# Patient Record
Sex: Male | Born: 1980 | Race: Black or African American | Hispanic: No | Marital: Married | Smoking: Current every day smoker
Health system: Southern US, Community
[De-identification: ages and names within clinical notes are randomized; demographics above are authoritative.]

## PROBLEM LIST (undated history)

## (undated) DIAGNOSIS — M62830 Muscle spasm of back: Secondary | ICD-10-CM

## (undated) DIAGNOSIS — F4310 Post-traumatic stress disorder, unspecified: Secondary | ICD-10-CM

## (undated) DIAGNOSIS — T7840XA Allergy, unspecified, initial encounter: Secondary | ICD-10-CM

## (undated) HISTORY — PX: BACK SURGERY: SHX140

## (undated) HISTORY — DX: Allergy, unspecified, initial encounter: T78.40XA

## (undated) HISTORY — PX: KNEE ARTHROSCOPY: SHX127

---

## 2005-08-10 ENCOUNTER — Emergency Department (HOSPITAL_COMMUNITY): Admission: EM | Admit: 2005-08-10 | Discharge: 2005-08-10 | Payer: Self-pay | Admitting: Emergency Medicine

## 2008-02-03 ENCOUNTER — Emergency Department (HOSPITAL_COMMUNITY): Admission: EM | Admit: 2008-02-03 | Discharge: 2008-02-04 | Payer: Self-pay | Admitting: Emergency Medicine

## 2012-12-13 ENCOUNTER — Ambulatory Visit: Payer: Self-pay | Admitting: Emergency Medicine

## 2012-12-13 VITALS — BP 110/80 | HR 76 | Temp 98.6°F | Resp 16 | Ht 73.75 in | Wt 191.0 lb

## 2012-12-13 DIAGNOSIS — R197 Diarrhea, unspecified: Secondary | ICD-10-CM

## 2012-12-13 DIAGNOSIS — A088 Other specified intestinal infections: Secondary | ICD-10-CM

## 2012-12-13 LAB — POCT CBC
Lymph, poc: 2.3 (ref 0.6–3.4)
MCH, POC: 28.4 pg (ref 27–31.2)
MCHC: 32.1 g/dL (ref 31.8–35.4)
MCV: 88.7 fL (ref 80–97)
MID (cbc): 0.6 (ref 0–0.9)
MPV: 9.8 fL (ref 0–99.8)
POC LYMPH PERCENT: 32.4 %L (ref 10–50)
POC MID %: 8.4 %M (ref 0–12)
Platelet Count, POC: 212 10*3/uL (ref 142–424)
WBC: 7.2 10*3/uL (ref 4.6–10.2)

## 2012-12-13 MED ORDER — LOPERAMIDE HCL 2 MG PO TABS: ORAL_TABLET | ORAL | Status: DC

## 2012-12-13 MED ORDER — ONDANSETRON 8 MG PO TBDP: 8.0000 mg | ORAL_TABLET | Freq: Three times a day (TID) | ORAL | Status: DC | PRN

## 2012-12-13 NOTE — Patient Instructions (Addendum)
Clear Liquid Diet The clear liquid dietconsists of foods that are liquid or will become liquid at room temperature.You should be able to see through the liquid and beverages. Examples of foods allowed on a clear liquid diet include fruit juice, broth or bouillon, gelatin, or frozen ice pops. The purpose of this diet is to provide necessary fluid, electrolytes such as sodium and potassium, and energy to keep the body functioning during times when you are not able to consume a regular diet.A clear liquid diet should not be continued for long periods of time as it is not nutritionally adequate.  REASONS FOR USING A CLEAR LIQUID DIET  In sudden onset (acute) conditions for a patient before or after surgery.  As the first step in oral feeding.  For fluid and electrolyte replacement in diarrheal diseases.  As a diet before certain medical tests are performed. ADEQUACY The clear liquid diet is adequate only in ascorbic acid, according to the Recommended Dietary Allowances of the National Research Council. CHOOSING FOODS Breads and Starches  Allowed:  None are allowed.  Avoid: All are avoided. Vegetables  Allowed:  Strained tomato or vegetable juice.  Avoid: Any others. Fruit  Allowed:  Strained fruit juices and fruit drinks. Include 1 serving of citrus or vitamin C-enriched fruit juice daily.  Avoid: Any others. Meat and Meat Substitutes  Allowed:  None are allowed.  Avoid: All are avoided. Milk  Allowed:  None are allowed.  Avoid: All are avoided. Soups and Combination Foods  Allowed:  Clear bouillon, broth, or strained broth-based soups.  Avoid: Any others. Desserts and Sweets  Allowed:  Sugar, honey. High protein gelatin. Flavored gelatin, ices, or frozen ice pops that do not contain milk.  Avoid: Any others. Fats and Oils  Allowed:  None are allowed.  Avoid: All are avoided. Beverages  Allowed: Cereal beverages, coffee (regular or decaffeinated), tea, or soda  at the discretion of your caregiver.  Avoid: Any others. Condiments  Allowed:  Iodized salt.  Avoid: Any others, including pepper. Supplements  Allowed:  Liquid nutrition beverages.  Avoid: Any others that contain lactose or fiber. SAMPLE MEAL PLAN Breakfast  4 oz (120 mL) strained orange juice.   to 1 cup (125 to 250 mL) gelatin (plain or fortified).  1 cup (250 mL) beverage (coffee or tea).  Sugar, if desired. Midmorning Snack   cup (125 mL) gelatin (plain or fortified). Lunch  1 cup (250 mL) broth or consomm.  4 oz (120 mL) strained grapefruit juice.   cup (125 mL) gelatin (plain or fortified).  1 cup (250 mL) beverage (coffee or tea).  Sugar, if desired. Midafternoon Snack   cup (125 mL) fruit ice.   cup (125 mL) strained fruit juice. Dinner  1 cup (250 mL) broth or consomm.   cup (125 mL) cranberry juice.   cup (125 mL) flavored gelatin (plain or fortified).  1 cup (250 mL) beverage (coffee or tea).  Sugar, if desired. Evening Snack  4 oz (120 mL) strained apple juice (vitamin C-fortified).   cup (125 mL) flavored gelatin (plain or fortified). Document Released: 09/08/2005 Document Revised: 12/01/2011 Document Reviewed: 12/06/2010 ExitCare Patient Information 2013 [employer]. Viral Gastroenteritis Viral gastroenteritis is also known as stomach flu. This condition affects the stomach and intestinal tract. It can cause sudden diarrhea and vomiting. The illness typically lasts 3 to 8 days. Most people develop an immune response that eventually gets rid of the virus. While this natural response develops, the virus can make you   quite ill. CAUSES  Many different viruses can cause gastroenteritis, such as rotavirus or noroviruses. You can catch one of these viruses by consuming contaminated food or water. You may also catch a virus by sharing utensils or other personal items with an infected person or by touching a contaminated  surface. SYMPTOMS  The most common symptoms are diarrhea and vomiting. These problems can cause a severe loss of body fluids (dehydration) and a body salt (electrolyte) imbalance. Other symptoms may include:  Fever.  Headache.  Fatigue.  Abdominal pain. DIAGNOSIS  Your caregiver can usually diagnose viral gastroenteritis based on your symptoms and a physical exam. A stool sample may also be taken to test for the presence of viruses or other infections. TREATMENT  This illness typically goes away on its own. Treatments are aimed at rehydration. The most serious cases of viral gastroenteritis involve vomiting so severely that you are not able to keep fluids down. In these cases, fluids must be given through an intravenous line (IV). HOME CARE INSTRUCTIONS   Drink enough fluids to keep your urine clear or pale yellow. Drink small amounts of fluids frequently and increase the amounts as tolerated.  Ask your caregiver for specific rehydration instructions.  Avoid:  Foods high in sugar.  Alcohol.  Carbonated drinks.  Tobacco.  Juice.  Caffeine drinks.  Extremely hot or cold fluids.  Fatty, greasy foods.  Too much intake of anything at one time.  Dairy products until 24 to 48 hours after diarrhea stops.  You may consume probiotics. Probiotics are active cultures of beneficial bacteria. They may lessen the amount and number of diarrheal stools in adults. Probiotics can be found in yogurt with active cultures and in supplements.  Wash your hands well to avoid spreading the virus.  Only take over-the-counter or prescription medicines for pain, discomfort, or fever as directed by your caregiver. Do not give aspirin to children. Antidiarrheal medicines are not recommended.  Ask your caregiver if you should continue to take your regular prescribed and over-the-counter medicines.  Keep all follow-up appointments as directed by your caregiver. SEEK IMMEDIATE MEDICAL CARE IF:    You are unable to keep fluids down.  You do not urinate at least once every 6 to 8 hours.  You develop shortness of breath.  You notice blood in your stool or vomit. This may look like coffee grounds.  You have abdominal pain that increases or is concentrated in one small area (localized).  You have persistent vomiting or diarrhea.  You have a fever.  The patient is a child younger than 3 months, and he or she has a fever.  The patient is a child older than 3 months, and he or she has a fever and persistent symptoms.  The patient is a child older than 3 months, and he or she has a fever and symptoms suddenly get worse.  The patient is a baby, and he or she has no tears when crying. MAKE SURE YOU:   Understand these instructions.  Will watch your condition.  Will get help right away if you are not doing well or get worse. Document Released: 09/08/2005 Document Revised: 12/01/2011 Document Reviewed: 06/25/2011 ExitCare Patient Information 2013 [employer].  

## 2012-12-13 NOTE — Progress Notes (Signed)
[facility] [address] [place] [address] [phone]  Date:  12/13/2012   Name:  Mario Matthews   DOB:  [birth year]   MRN:  [id number]  PCP:  No primary provider on file.    Chief Complaint: Diarrhea, Nausea and Abdominal Pain   History of Present Illness:  Mario Matthews is a 31 y.o. very pleasant male patient who presents with the following:  Ill with diarrheal stools for past 1 1/2 weeks.  Has 5-7 stools a day associated with nausea although he has not vomited.  No fever or chills. The patient has no complaint of blood, mucous, or pus in her stools. Multiple ill coworkers with similar symptoms.  Denies foreign travel or sketchy water supply.  Says he has lost 5-7 pounds.  No improvement with over the counter medications or other home remedies. Denies other complaint or health concern today.   There is no problem list on file for this patient.   Past Medical History  Diagnosis Date  . Allergy     Past Surgical History  Procedure Laterality Date  . Knee arthroscopy      History  Substance Use Topics  . Smoking status: Current Every Day Smoker  . Smokeless tobacco: Not on file  . Alcohol Use: 8.4 oz/week    14 Cans of beer per week    Family History  Problem Relation Age of Onset  . Stroke Mother     No Known Allergies  Medication list has been reviewed and updated.  No current outpatient prescriptions on file prior to visit.   No current facility-administered medications on file prior to visit.    Review of Systems:  As per HPI, otherwise negative.    Physical Examination: Filed Vitals:   12/13/12 1527  BP: 110/80  Pulse: 76  Temp: 98.6 F (37 C)  Resp: 16   Filed Vitals:   12/13/12 1527  Height: 6' 1.75" (1.873 m)  Weight: 191 lb (86.637 kg)   Body mass index is 24.7 kg/(m^2). Ideal Body Weight: Weight in (lb) to have BMI = 25: 193  GEN: WDWN, NAD, Non-toxic, A & O x 3 HEENT: Atraumatic, Normocephalic. Neck  supple. No masses, No LAD. Ears and Nose: No external deformity. CV: RRR, No M/G/R. No JVD. No thrill. No extra heart sounds. PULM: CTA B, no wheezes, crackles, rhonchi. No retractions. No resp. distress. No accessory muscle use. ABD: S, NT, ND, +BS. No rebound. No HSM. EXTR: No c/c/e NEURO Normal gait.  PSYCH: Normally interactive. Conversant. Not depressed or anxious appearing.  Calm demeanor. .   Assessment and Plan: Gastroenteritis Imodium zofran   Signed,  Phillips Odor, MD   Results for orders placed in visit on 12/13/12  POCT CBC      Result Value Range   WBC 7.2  4.6 - 10.2 K/uL   Lymph, poc 2.3  0.6 - 3.4   POC LYMPH PERCENT 32.4  10 - 50 %L   MID (cbc) 0.6  0 - 0.9   POC MID % 8.4  0 - 12 %M   POC Granulocyte 4.3  2 - 6.9   Granulocyte percent 59.2  37 - 80 %G   RBC 5.00  4.69 - 6.13 M/uL   Hemoglobin 14.2  14.1 - 18.1 g/dL   HCT, POC 65.7  84.6 - 53.7 %   MCV 88.7  80 - 97 fL   MCH, POC 28.4  27 - 31.2 pg   MCHC  32.1  31.8 - 35.4 g/dL   RDW, POC 16.1     Platelet Count, POC 212  142 - 424 K/uL   MPV 9.8  0 - 99.8 fL

## 2014-03-16 ENCOUNTER — Emergency Department (HOSPITAL_BASED_OUTPATIENT_CLINIC_OR_DEPARTMENT_OTHER)
Admission: EM | Admit: 2014-03-16 | Discharge: 2014-03-16 | Disposition: A | Discharge status: Home / Self Care | Payer: Non-veteran care | Attending: Emergency Medicine | Admitting: Emergency Medicine

## 2014-03-16 ENCOUNTER — Encounter (HOSPITAL_BASED_OUTPATIENT_CLINIC_OR_DEPARTMENT_OTHER): Payer: Self-pay | Admitting: Emergency Medicine

## 2014-03-16 DIAGNOSIS — F17200 Nicotine dependence, unspecified, uncomplicated: Secondary | ICD-10-CM | POA: Insufficient documentation

## 2014-03-16 DIAGNOSIS — Z8659 Personal history of other mental and behavioral disorders: Secondary | ICD-10-CM | POA: Insufficient documentation

## 2014-03-16 DIAGNOSIS — M62830 Muscle spasm of back: Secondary | ICD-10-CM

## 2014-03-16 DIAGNOSIS — M5380 Other specified dorsopathies, site unspecified: Secondary | ICD-10-CM | POA: Insufficient documentation

## 2014-03-16 DIAGNOSIS — Y939 Activity, unspecified: Secondary | ICD-10-CM | POA: Insufficient documentation

## 2014-03-16 DIAGNOSIS — Y929 Unspecified place or not applicable: Secondary | ICD-10-CM | POA: Insufficient documentation

## 2014-03-16 DIAGNOSIS — S335XXA Sprain of ligaments of lumbar spine, initial encounter: Secondary | ICD-10-CM | POA: Insufficient documentation

## 2014-03-16 DIAGNOSIS — S39012A Strain of muscle, fascia and tendon of lower back, initial encounter: Secondary | ICD-10-CM

## 2014-03-16 DIAGNOSIS — G8929 Other chronic pain: Secondary | ICD-10-CM | POA: Insufficient documentation

## 2014-03-16 DIAGNOSIS — X58XXXA Exposure to other specified factors, initial encounter: Secondary | ICD-10-CM | POA: Insufficient documentation

## 2014-03-16 HISTORY — DX: Muscle spasm of back: M62.830

## 2014-03-16 MED ORDER — DIAZEPAM 5 MG PO TABS
5.0000 mg | ORAL_TABLET | Freq: Once | ORAL | Status: DC
  Filled 2014-03-16: qty 1

## 2014-03-16 MED ORDER — IBUPROFEN 800 MG PO TABS
800.0000 mg | ORAL_TABLET | Freq: Once | ORAL | Status: AC
  Administered 2014-03-16: 800 mg via ORAL
  Filled 2014-03-16: qty 1

## 2014-03-16 MED ORDER — DIAZEPAM 5 MG PO TABS: 5.0000 mg | ORAL_TABLET | Freq: Three times a day (TID) | ORAL | Status: DC | PRN

## 2014-03-16 MED ORDER — OXYCODONE-ACETAMINOPHEN 5-325 MG PO TABS: 1.0000 | ORAL_TABLET | ORAL | Status: DC | PRN

## 2014-03-16 MED ORDER — METHYLPREDNISOLONE 4 MG PO KIT
PACK | ORAL | Status: DC
Start: 2014-03-16 — End: 2020-01-24

## 2014-03-16 MED ORDER — OXYCODONE-ACETAMINOPHEN 5-325 MG PO TABS
2.0000 | ORAL_TABLET | Freq: Once | ORAL | Status: DC
  Filled 2014-03-16: qty 2

## 2014-03-16 NOTE — ED Notes (Signed)
Pt c/o back spasms since 6/18 with hx of same since 2008-pt is in w/c with intermittent jerking

## 2014-03-16 NOTE — ED Notes (Signed)
MD at bedside. 

## 2014-03-16 NOTE — ED Provider Notes (Signed)
TIME SEEN: 3:17 PM  CHIEF COMPLAINT: Back pain, spasms  HPI: Patient is a 33 year old male with history of chronic back pain who presents to the emergency department with complaints of back spasms and pain that flared up one week ago. He has had history of chronic back pain since 2008. He states that he works at a [employer] and has to stand on his feet for long hours and he feels this may have exacerbated his pain. No other new injury. He denies any numbness or focal weakness. Denies any bowel or bladder incontinence. No fever. He states he does have some radiation of pain into his right posterior lower extremity. He states that [facility] and will take Percocet, Flexeril and ibuprofen. He states he has been taking ibuprofen this week with some minimal relief. He states his pain is worse with movement.  ROS: See HPI Constitutional: no fever  Eyes: no drainage  ENT: no runny nose   Cardiovascular:  no chest pain  Resp: no SOB  GI: no vomiting GU: no dysuria Integumentary: no rash  Allergy: no hives  Musculoskeletal: no leg swelling  Neurological: no slurred speech ROS otherwise negative  PAST MEDICAL HISTORY/PAST SURGICAL HISTORY:  Past Medical History  Diagnosis Date  . Allergy   . Back muscle spasm     MEDICATIONS:  Prior to Admission medications   Medication Sig Start Date End Date Taking? Authorizing Provider  loperamide (IMODIUM A-D) 2 MG tablet 2 tabs now and one hourly prn diarrhea.  Max 8 in 24 hours 12/13/12   Phillips OdorJeffery Anderson, MD  ondansetron (ZOFRAN-ODT) 8 MG disintegrating tablet Take 1 tablet (8 mg total) by mouth every 8 (eight) hours as needed for nausea. 12/13/12   Phillips OdorJeffery Anderson, MD    ALLERGIES:  No Known Allergies  SOCIAL HISTORY:  History  Substance Use Topics  . Smoking status: Current Every Day Smoker  . Smokeless tobacco: Not on file  . Alcohol Use: Yes    FAMILY HISTORY: Family History  Problem Relation Age of Onset   . Stroke Mother     EXAM: BP 126/76  Pulse 90  Temp(Src) 98.8 F (37.1 C) (Oral)  Resp 18  Ht 6\' 4"  (1.93 m)  Wt 184 lb (83.462 kg)  BMI 22.41 kg/m2  SpO2 100% CONSTITUTIONAL: Alert and oriented and responds appropriately to questions. Well-appearing; well-nourished, nontoxic, patient keeps his eyes closed during the exam, appears uncomfortable HEAD: Normocephalic EYES: Conjunctivae clear, PERRL ENT: normal nose; no rhinorrhea; moist mucous membranes; pharynx without lesions noted NECK: Supple, no meningismus, no LAD  CARD: RRR; S1 and S2 appreciated; no murmurs, no clicks, no rubs, no gallops RESP: Normal chest excursion without splinting or tachypnea; breath sounds clear and equal bilaterally; no wheezes, no rhonchi, no rales,  ABD/GI: Normal bowel sounds; non-distended; soft, non-tender, no rebound, no guarding BACK:  The back appears normal but is diffusely tender to palpation with even light palpation, no midline step-off or deformity, there is some tight paraspinal musculature bilaterally, no lesions noted on the back EXT: Normal ROM in all joints; non-tender to palpation; no edema; normal capillary refill; no cyanosis    SKIN: Normal color for age and race; warm NEURO: Moves all extremities equally, 2+ deep tendon reflexes in bilateral upper and lower extremities, no clonus, sensation to light touch intact diffusely, patient has difficulty moving his lower extremity secondary to pain but no true weakness; patient will intermittently jerk in the bed and states  it's due to his muscle spasms of his back PSYCH: The patient's mood and manner are appropriate. Grooming and personal hygiene are appropriate.  MEDICAL DECISION MAKING: Patient here with an exacerbation of his chronic back pain. Patient does appear uncomfortable but has no right flexor his back pain including no history of injury, fever, numbness or focal weakness, bowel or bladder incontinence. Have offered him ibuprofen,  Percocet and Valium. He refuses to take Percocet and Valium in the ED stating that he has PTSD and he needs to take these medications at home. He is requesting a work note. I do not feel there is any need for any back imaging at this time. I think he likely has sciatica versus muscle spasm versus back strain. I am not concerned for epidural abscess, cauda equina, transverse myelitis, discitis or osteomyelitis, spinal stenosis. Have discussed return precautions and supportive care instructions including rest, back exercises, heat and importance of anti-inflammatories. Patient and girlfriend bedside verbalize understanding and are comfortable with plan. We'll discharge with prescription for Percocet, Valium and Medrol Dosepak.       Layla MawKristen N Ward, DO 03/16/14 1556

## 2014-03-16 NOTE — Discharge Instructions (Signed)
Back Exercises Back exercises help treat and prevent back injuries. The goal of back exercises is to increase the strength of your abdominal and back muscles and the flexibility of your back. These exercises should be started when you no longer have back pain. Back exercises include:  Pelvic Tilt. Lie on your back with your knees bent. Tilt your pelvis until the lower part of your back is against the floor. Hold this position 5 to 10 sec and repeat 5 to 10 times.  Knee to Chest. Pull first 1 knee up against your chest and hold for 20 to 30 seconds, repeat this with the other knee, and then both knees. This may be done with the other leg straight or bent, whichever feels better.  Sit-Ups or Curl-Ups. Bend your knees 90 degrees. Start with tilting your pelvis, and do a partial, slow sit-up, lifting your trunk only 30 to 45 degrees off the floor. Take at least 2 to 3 seconds for each sit-up. Do not do sit-ups with your knees out straight. If partial sit-ups are difficult, simply do the above but with only tightening your abdominal muscles and holding it as directed.  Hip-Lift. Lie on your back with your knees flexed 90 degrees. Push down with your feet and shoulders as you raise your hips a couple inches off the floor; hold for 10 seconds, repeat 5 to 10 times.  Back arches. Lie on your stomach, propping yourself up on bent elbows. Slowly press on your hands, causing an arch in your low back. Repeat 3 to 5 times. Any initial stiffness and discomfort should lessen with repetition over time.  Shoulder-Lifts. Lie face down with arms beside your body. Keep hips and torso pressed to floor as you slowly lift your head and shoulders off the floor. Do not overdo your exercises, especially in the beginning. Exercises may cause you some mild back discomfort which lasts for a few minutes; however, if the pain is more severe, or lasts for more than 15 minutes, do not continue exercises until you see your caregiver.  Improvement with exercise therapy for back problems is slow.  See your caregivers for assistance with developing a proper back exercise program. Document Released: 10/16/2004 Document Revised: 12/01/2011 Document Reviewed: 07/10/2011 [facility] Patient Information 2015 [employer], [employer]. This information is not intended to replace advice given to you by your health care provider. Make sure you discuss any questions you have with your health care provider.  Muscle Cramps and Spasms Muscle cramps and spasms occur when a muscle or muscles tighten and you have no control over this tightening (involuntary muscle contraction). They are a common problem and can develop in any muscle. The most common place is in the calf muscles of the leg. Both muscle cramps and muscle spasms are involuntary muscle contractions, but they also have differences:   Muscle cramps are sporadic and painful. They may last a few seconds to a quarter of an hour. Muscle cramps are often more forceful and last longer than muscle spasms.  Muscle spasms may or may not be painful. They may also last just a few seconds or much longer. CAUSES  It is uncommon for cramps or spasms to be due to a serious underlying problem. In many cases, the cause of cramps or spasms is unknown. Some common causes are:   Overexertion.   Overuse from repetitive motions (doing the same thing over and over).   Remaining in a certain position for a long period of time.  Improper preparation, form, or technique while performing a sport or activity.   Dehydration.   Injury.   Side effects of some medicines.   Abnormally low levels of the salts and ions in your blood (electrolytes), especially potassium and calcium. This could happen if you are taking water pills (diuretics) or you are pregnant.  Some underlying medical problems can make it more likely to develop cramps or spasms. These include, but are not limited to:   Diabetes.   Parkinson  disease.   Hormone disorders, such as thyroid problems.   Alcohol abuse.   Diseases specific to muscles, joints, and bones.   Blood vessel disease where not enough blood is getting to the muscles.  HOME CARE INSTRUCTIONS   Stay well hydrated. Drink enough water and fluids to keep your urine clear or pale yellow.  It may be helpful to massage, stretch, and relax the affected muscle.  For tight or tense muscles, use a warm towel, heating pad, or hot shower water directed to the affected area.  If you are sore or have pain after a cramp or spasm, applying ice to the affected area may relieve discomfort.  Put ice in a plastic bag.  Place a towel between your skin and the bag.  Leave the ice on for 15-20 minutes, 03-04 times a day.  Medicines used to treat a known cause of cramps or spasms may help reduce their frequency or severity. Only take over-the-counter or prescription medicines as directed by your caregiver. SEEK MEDICAL CARE IF:  Your cramps or spasms get more severe, more frequent, or do not improve over time.  MAKE SURE YOU:   Understand these instructions.  Will watch your condition.  Will get help right away if you are not doing well or get worse. Document Released: 02/28/2002 Document Revised: 01/03/2013 Document Reviewed: 08/25/2012 [facility] Patient Information 2015 [place], [place]. This information is not intended to replace advice given to you by your health care provider. Make sure you discuss any questions you have with your health care provider.  Lumbosacral Strain Lumbosacral strain is a strain of any of the parts that make up your lumbosacral vertebrae. Your lumbosacral vertebrae are the bones that make up the lower third of your backbone. Your lumbosacral vertebrae are held together by muscles and tough, fibrous tissue (ligaments).  CAUSES  A sudden blow to your back can cause lumbosacral strain. Also, anything that causes an excessive stretch of the  muscles in the low back can cause this strain. This is typically seen when people exert themselves strenuously, fall, lift heavy objects, bend, or crouch repeatedly. RISK FACTORS  Physically demanding work.  Participation in pushing or pulling sports or sports that require a sudden twist of the back (tennis, golf, baseball).  Weight lifting.  Excessive lower back curvature.  Forward-tilted pelvis.  Weak back or abdominal muscles or both.  Tight hamstrings. SIGNS AND SYMPTOMS  Lumbosacral strain may cause pain in the area of your injury or pain that moves (radiates) down your leg.  DIAGNOSIS Your health care provider can often diagnose lumbosacral strain through a physical exam. In some cases, you may need tests such as X-ray exams.  TREATMENT  Treatment for your lower back injury depends on many factors that your clinician will have to evaluate. However, most treatment will include the use of anti-inflammatory medicines. HOME CARE INSTRUCTIONS   Avoid hard physical activities (tennis, racquetball, waterskiing) if you are not in proper physical condition for it. This may aggravate or  create problems.  If you have a back problem, avoid sports requiring sudden body movements. Swimming and walking are generally safer activities.  Maintain good posture.  Maintain a healthy weight.  For acute conditions, you may put ice on the injured area.  Put ice in a plastic bag.  Place a towel between your skin and the bag.  Leave the ice on for 20 minutes, 2-3 times a day.  When the low back starts healing, stretching and strengthening exercises may be recommended. SEEK MEDICAL CARE IF:  Your back pain is getting worse.  You experience severe back pain not relieved with medicines. SEEK IMMEDIATE MEDICAL CARE IF:   You have numbness, tingling, weakness, or problems with the use of your arms or legs.  There is a change in bowel or bladder control.  You have increasing pain in any  area of the body, including your belly (abdomen).  You notice shortness of breath, dizziness, or feel faint.  You feel sick to your stomach (nauseous), are throwing up (vomiting), or become sweaty.  You notice discoloration of your toes or legs, or your feet get very cold. MAKE SURE YOU:   Understand these instructions.  Will watch your condition.  Will get help right away if you are not doing well or get worse. Document Released: 06/18/2005 Document Revised: 09/13/2013 Document Reviewed: 04/27/2013 [facility] Patient Information 2015 [place]Care, [place]. This information is not intended to replace advice given to you by your health care provider. Make sure you discuss any questions you have with your health care provider.

## 2014-03-23 ENCOUNTER — Encounter (HOSPITAL_BASED_OUTPATIENT_CLINIC_OR_DEPARTMENT_OTHER): Payer: Self-pay | Admitting: Emergency Medicine

## 2014-03-23 ENCOUNTER — Emergency Department (HOSPITAL_BASED_OUTPATIENT_CLINIC_OR_DEPARTMENT_OTHER)
Admission: EM | Admit: 2014-03-23 | Discharge: 2014-03-23 | Disposition: A | Discharge status: Home / Self Care | Payer: Non-veteran care | Attending: Emergency Medicine | Admitting: Emergency Medicine

## 2014-03-23 DIAGNOSIS — F17200 Nicotine dependence, unspecified, uncomplicated: Secondary | ICD-10-CM | POA: Insufficient documentation

## 2014-03-23 DIAGNOSIS — M545 Low back pain, unspecified: Secondary | ICD-10-CM | POA: Insufficient documentation

## 2014-03-23 DIAGNOSIS — G8929 Other chronic pain: Secondary | ICD-10-CM | POA: Insufficient documentation

## 2014-03-23 LAB — URINALYSIS, ROUTINE W REFLEX MICROSCOPIC
Bilirubin Urine: NEGATIVE
GLUCOSE, UA: NEGATIVE mg/dL
Hgb urine dipstick: NEGATIVE
Ketones, ur: NEGATIVE mg/dL
LEUKOCYTES UA: NEGATIVE
Nitrite: NEGATIVE
PH: 8 (ref 5.0–8.0)
PROTEIN: 30 mg/dL — AB
SPECIFIC GRAVITY, URINE: 1.02 (ref 1.005–1.030)
Urobilinogen, UA: 0.2 mg/dL (ref 0.0–1.0)

## 2014-03-23 LAB — URINE MICROSCOPIC-ADD ON

## 2014-03-23 NOTE — Discharge Instructions (Signed)

## 2014-03-23 NOTE — ED Provider Notes (Signed)
CSN: [id number]     Arrival date & time 03/23/14  1630 History   First MD Initiated Contact with Patient 03/23/14 1658     Chief Complaint  Patient presents with  . Back Pain     (Consider location/radiation/quality/duration/timing/severity/associated sxs/prior Treatment) Patient is a 33 y.o. male presenting with back pain.  Back Pain Location:  Lumbar spine Quality:  Shooting and burning Radiates to: hips. Pain severity:  Severe Onset quality:  Gradual Duration:  2 weeks Timing:  Constant Progression:  Worsening Chronicity:  Recurrent Context comment:  Hx of chronic back pain.  Seen in ED 1 week ago and subsequently took week off work.  Went back yesterday and is worried this exacerbated his pain.  He bends and lifts and twists at work.  Relieved by: percocet masks t he pain. Worsened by:  Movement and twisting Associated symptoms: no abdominal pain, no bladder incontinence, no bowel incontinence, no dysuria, no fever, no numbness, no paresthesias, no perianal numbness and no weakness   Associated symptoms comment:  Legs gave out on him today when he got a jolt of pain while walking.  He had to grab onto something to keep from falling.   Past Medical History  Diagnosis Date  . Allergy   . Back muscle spasm    Past Surgical History  Procedure Laterality Date  . Knee arthroscopy     Family History  Problem Relation Age of Onset  . Stroke Mother    History  Substance Use Topics  . Smoking status: Current Every Day Smoker  . Smokeless tobacco: Not on file  . Alcohol Use: Yes    Review of Systems  Constitutional: Negative for fever.  Gastrointestinal: Negative for abdominal pain and bowel incontinence.  Genitourinary: Negative for bladder incontinence and dysuria.  Musculoskeletal: Positive for back pain.  Neurological: Negative for weakness, numbness and paresthesias.  All other systems reviewed and are negative.     Allergies  Review of patient's allergies  indicates no known allergies.  Home Medications   Prior to Admission medications   Medication Sig Start Date End Date Taking? Authorizing Provider  diazepam (VALIUM) 5 MG tablet Take 1 tablet (5 mg total) by mouth every 8 (eight) hours as needed for muscle spasms. 03/16/14   Kristen N Ward, DO  methylPREDNISolone (MEDROL DOSEPAK) 4 MG tablet Take as directed 03/16/14   Kristen N Ward, DO  ondansetron (ZOFRAN-ODT) 8 MG disintegrating tablet Take 1 tablet (8 mg total) by mouth every 8 (eight) hours as needed for nausea. 12/13/12   Phillips OdorJeffery Anderson, MD   BP 151/90  Pulse 86  Temp(Src) 98.8 F (37.1 C) (Oral)  Resp 18  Ht 6\' 4"  (1.93 m)  Wt 191 lb (86.637 kg)  BMI 23.26 kg/m2  SpO2 98% Physical Exam  Nursing note and vitals reviewed. Constitutional: He is oriented to person, place, and time. He appears well-developed and well-nourished. No distress.  HENT:  Head: Normocephalic and atraumatic.  Eyes: Conjunctivae are normal. No scleral icterus.  Neck: Neck supple.  Cardiovascular: Normal rate and intact distal pulses.   Pulmonary/Chest: Effort normal. No stridor. No respiratory distress.  Abdominal: Normal appearance. He exhibits no distension.  Musculoskeletal:       Lumbar back: He exhibits tenderness and bony tenderness.  Neurological: He is alert and oriented to person, place, and time. He has normal strength. Gait (antalgic) abnormal.  Reflex Scores:      Patellar reflexes are 1+ on the right side and 1+ on the  left side. Strength exam somewhat effort dependent, but appears to be intact.    Skin: Skin is warm and dry. No rash noted.  Psychiatric: He has a normal mood and affect. His behavior is normal.    ED Course  Procedures (including critical care time) Labs Review Labs Reviewed  URINALYSIS, ROUTINE W REFLEX MICROSCOPIC - Abnormal; Notable for the following:    Protein, ur 30 (*)    All other components within normal limits  URINE MICROSCOPIC-ADD ON    Imaging  Review No results found.   EKG Interpretation None      MDM   Final diagnoses:  Chronic low back pain    33 yo male with hx of chronic back pain, presenting with the same, worse since trying to go back to work yesterday.  Pt was somewhat histrionic, limiting initial exam.  However, after having a long conversation with patient, I was able to get a good exam.  He had no lower extremity weakness, good reflexes, and intact sensation.  No signs or symptoms of emergent spinal pathology.  I do not see indication for MR imaging.  Pt declined pain medications in the ED.  Advised to use NSAIDs and follow up with PCP.    Candyce ChurnJohn David Kijana Estock III, MD 03/23/14 [phone]

## 2014-03-23 NOTE — ED Notes (Signed)
Injury to back 1-2 weeks ago.   Tried to go to work today  "took 10 steps and couldn't make it any further"

## 2014-08-01 ENCOUNTER — Emergency Department (HOSPITAL_COMMUNITY)
Admission: EM | Admit: 2014-08-01 | Discharge: 2014-08-01 | Disposition: A | Discharge status: Home / Self Care | Payer: Non-veteran care | Attending: Emergency Medicine | Admitting: Emergency Medicine

## 2014-08-01 ENCOUNTER — Encounter (HOSPITAL_COMMUNITY): Payer: Self-pay | Admitting: Neurology

## 2014-08-01 ENCOUNTER — Emergency Department (HOSPITAL_COMMUNITY): Payer: Non-veteran care

## 2014-08-01 DIAGNOSIS — Z791 Long term (current) use of non-steroidal anti-inflammatories (NSAID): Secondary | ICD-10-CM | POA: Diagnosis not present

## 2014-08-01 DIAGNOSIS — Y998 Other external cause status: Secondary | ICD-10-CM | POA: Insufficient documentation

## 2014-08-01 DIAGNOSIS — Y92410 Unspecified street and highway as the place of occurrence of the external cause: Secondary | ICD-10-CM | POA: Diagnosis not present

## 2014-08-01 DIAGNOSIS — IMO0002 Reserved for concepts with insufficient information to code with codable children: Secondary | ICD-10-CM

## 2014-08-01 DIAGNOSIS — Z79899 Other long term (current) drug therapy: Secondary | ICD-10-CM | POA: Diagnosis not present

## 2014-08-01 DIAGNOSIS — S79911A Unspecified injury of right hip, initial encounter: Secondary | ICD-10-CM | POA: Diagnosis present

## 2014-08-01 DIAGNOSIS — Z720 Tobacco use: Secondary | ICD-10-CM | POA: Insufficient documentation

## 2014-08-01 DIAGNOSIS — Z8659 Personal history of other mental and behavioral disorders: Secondary | ICD-10-CM | POA: Insufficient documentation

## 2014-08-01 DIAGNOSIS — Y9389 Activity, other specified: Secondary | ICD-10-CM | POA: Insufficient documentation

## 2014-08-01 HISTORY — DX: Post-traumatic stress disorder, unspecified: F43.10

## 2014-08-01 MED ORDER — METHOCARBAMOL 500 MG PO TABS
750.0000 mg | ORAL_TABLET | Freq: Once | ORAL | Status: DC
  Filled 2014-08-01: qty 2

## 2014-08-01 MED ORDER — MORPHINE SULFATE 4 MG/ML IJ SOLN: 4.0000 mg | Freq: Once | INTRAMUSCULAR | Status: DC
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
## 2014-08-01 NOTE — ED Notes (Signed)
Patient transported to X-ray 

## 2014-08-01 NOTE — ED Notes (Signed)
Pt removed c-collar on his own

## 2014-08-01 NOTE — Discharge Instructions (Signed)
Do not hesitate to return to the emergency room for any new, worsening or concerning symptoms. ° °Please obtain primary care using resource guide below. But the minute you were seen in the emergency room and that they will need to obtain records for further outpatient management. ° ° ° °Emergency Department Resource Guide °1) Find a Doctor and Pay Out of Pocket °Although you won't have to find out who is covered by your insurance plan, it is a good idea to ask around and get recommendations. You will then need to call the office and see if the doctor you have chosen will accept you as a new patient and what types of options they offer for patients who are self-pay. Some doctors offer discounts or will set up payment plans for their patients who do not have insurance, but you will need to ask so you aren't surprised when you get to your appointment. ° °2) Contact Your Local Health Department °Not all health departments have doctors that can see patients for sick visits, but many do, so it is worth a call to see if yours does. If you don't know where your local health department is, you can check in your phone book. The CDC also has a tool to help you locate your state's health department, and many state websites also have listings of all of their local health departments. ° °3) Find a Walk-in Clinic °If your illness is not likely to be very severe or complicated, you may want to try a walk in clinic. These are popping up all over the country in pharmacies, drugstores, and shopping centers. They're usually staffed by nurse practitioners or physician assistants that have been trained to treat common illnesses and complaints. They're usually fairly quick and inexpensive. However, if you have serious medical issues or chronic medical problems, these are probably not your best option. ° °No Primary Care Doctor: °- Call [facility] at  832-8000 - they can help you locate a primary care doctor that  accepts your  insurance, provides certain services, etc. °- Physician Referral Service- 1-800-533-3463 ° °Chronic Pain Problems: °Organization         Address  Phone   Notes  °[facility]  (336) 297-2271 Patients need to be referred by their primary care doctor.  ° °Medication Assistance: °Organization         Address  Phone   Notes  °[place] [facility] [address] °[place], [place] [address] (336) 641-8030 --Must be a resident of [place] °-- Must have NO insurance coverage whatsoever (no Medicaid/ Medicare, etc.) °-- The pt. MUST have a primary care doctor that directs their care regularly and follows them in the community °  °[facility]  (866) 331-1348   °[employer]  (888) 892-1162   ° °Agencies that provide inexpensive medical care: °Organization         Address  Phone   Notes  °[facility]  (336) 832-8035   ° Internal Medicine    (336) 832-7272   °[facility] [address] °[place], [place] [address] (336) 832-4777   °[facility] [address], °[place] (336) 271-4999   °[facility]    (336) 373-0678   °[facility]    (336) 272-1050   °[facility] ° [address], [place] Phone:  (336) 832-4444, Fax:  (336) 832-4440 Hours of Operation:  9 am -   6 pm, M-F.  Also accepts Medicaid/Medicare and self-pay.  °[facility] ° [address], [place] Phone: (336) 832-3150, Fax: (336) 832-3151. Hours of Operation:  8:30 am - 5:30 pm, M-F.  Also accepts Medicaid and self-pay.  °[facility] [place] [address], [place] Phone: (336) 878-6027   °[facility] [address], [place], [place] (336)723-1848, Ext. 123 Mondays & Thursdays: 7-9 AM.  First 15 patients are seen on a first come, first serve basis. °  ° °Medicaid-accepting [place] Providers: ° °Organization          Address  Phone   Notes  °[facility] [address], [place] (336) 641-2100 Also accepts self-pay patients.  °[facility] [address], [place] ° (336) 856-9996   °[facility] [address], [place] (336) 288-8857   °[facility] [address] [place] [address], [place] (336) 299-7000   °Veita Bland [address], [place]  ° (336) 373-1557 Only accepts [place] Access Medicaid patients after they have their name applied to their card.  ° °Self-Pay (no insurance) in [place]: ° °Organization         Address  Phone   Notes  °Sickle Cell Patients, [facility] [address], [place] (336) 832-1970   °[facility] [address], [place] (336) 832-4400   °[facility] ° [address], [place] (336) 992-4800   °[facility]/Dr. Osei-Bonsu ° [address] [place] [address], [place] or [address], [place] (336) 841-8500 Phone number for both [place] and [place] locations is the same.  °[facility] [address], [place] (336) 299-0000   °[facility] [address] [place] [address], [place] or [address] (336) 852-7530 °(336) 878-2260   °[facility] [address], [place] (336) 350-1642, phone; (336) 294-5005, fax Sees patients 1st and 3rd Saturday of every month.  Must not qualify for public or private insurance (i.e. Medicaid, Medicare, [place] Health Choice, Veterans' Benefits) • Household income should be no more than 200% of the poverty level •The clinic cannot treat you if you are pregnant or think you are pregnant • Sexually transmitted diseases are not treated at the clinic.  ° ° °Dental Care: °Organization         Address  Phone  Notes  °[facility] [address],  [place] (336) 641-6152 Accepts children up to age 21 who are enrolled in Medicaid or [place] Health Choice; pregnant women with a Medicaid card; and children who have applied for Medicaid or [place] Health Choice, but were declined, whose parents can pay a reduced fee at time of service.  °[facility] [place]  [address], [place] (336) 641-7733 Accepts children up to age 21 who are enrolled in Medicaid or [place] Health Choice; pregnant women with a Medicaid card; and children who have applied for Medicaid or [place] Health Choice, but were declined, whose parents can pay a reduced fee at time of service.  °[facility] ° [address], [place] (336) 641-4533 Patients are seen by appointment only. Walk-ins are not accepted. [facility] will see patients 18 years of age and   older. °Monday - Tuesday (8am-5pm) °Most Wednesdays (8:30-5pm) °$30 per visit, cash only  °[facility] ° [address], [place] (336) 641-4533 Patients are seen by appointment only. Walk-ins are not accepted. [facility] will see patients 18 years of age and older. °One Wednesday Evening (Monthly: Volunteer Based).  $30 per visit, cash only  °[facility]  (919) 537-3737 for adults; Children under age 4, call Graduate Pediatric Dentistry at (919) [phone]. Children aged 4-14, please call (919) 537-3737 to request a pediatric application. ° Dental services are provided in all areas of dental care including fillings, crowns and bridges, complete and partial dentures, implants, gum treatment, root canals, and extractions. Preventive care is also provided. Treatment is provided to both adults and children. °Patients are selected via a lottery and there is often a waiting list. °  °[facility] [address], °[place] ° (336) 763-8833 [email or web address] °  °[facility] [address], [place], [place]  (336)723-1848, Ext. 123 Second and Fourth Thursday of each month, opens at 6:30 AM; Clinic ends at 9 AM.  Patients are seen on a first-come first-served basis, and a limited number are seen during each clinic.  ° °[facility] ° [address], [place], [place] (336) 723-7904   Eligibility Requirements °You must have lived in [place], [place], or [place] counties for at least the last three months. °  You cannot be eligible for state or federal sponsored healthcare insurance, including Veterans Administration, Medicaid, or Medicare. °  You generally cannot be eligible for healthcare insurance through your employer.  °  How to apply: °Eligibility screenings are held every Tuesday and Wednesday afternoon from 1:00 pm until 4:00 pm. You do not need an appointment for the interview!  °[facility] [address], [place], [place] 336-631-2330   °[facility]  336-342-8273   °[facility]  336-703-3100   °[facility]  336-570-6415   ° °Behavioral Health Resources in the Community: °Intensive Outpatient Programs °Organization         Address  Phone  Notes  °[facility] [address], [place], [place] 336-878-6098   °[facility] [address], [place], [place] 336-832-9800   °[facility] [address], [place], [place] ° 336-882-2125   °[facility] [address],  °[place], [place] 1-800-853-5163 or 336-641-4981   °Substance Abuse Resources °Organization         Address  Phone  Notes  °[facility]  336-882-2125   °[facility]  336-784-9470   °[facility]  336-285-9073   °[facility]  336-845-3988   °Residential & Outpatient Substance Abuse Program  1-800-659-3381   °Psychological Services °Organization         Address  Phone  Notes  °[facility]  336- 832-9600   °[facility]  336- 378-7881    °[facility] [address], [place] 1-800-853-5163 or 336-641-4981   ° °Mobile Crisis Teams °Organization         Address  Phone  Notes  °[facility], Mobile Crisis Care Unit  1-877-626-1772   °Assertive °[facility] ° [address] [place], [place] 336-834-9664   °Sharon DeEsch [address] °[place] 336-554-5454   ° °Self-Help/Support Groups °Organization         Address    Phone             Notes  °[facility] - variety of support groups  336- 373-1402 Call for more information  °Narcotics Anonymous (NA), [facility] [address], °[place]  2 meetings at this location  ° °Residential Treatment Programs °Organization         Address  Phone  Notes  °[facility] [address],    °[place]  1-866-801-8205   °[facility] ° [address], [place], [place] 704-293-8524   °[facility] [address], [place] 336-845-3988 Admissions: 8am-3pm M-F  °[facility] [address]    °[place], [place] 336-841-1104   °[facility] [address], [place], [place] 336-379-7146   °[facility] [address]  °[place], [place] 336-285-9073   °[facility] - Intensive Outpatient [address], [place], [place] 336-852-3033   °[facility] [address]  °[place], [place] 1-877-615-2722 or 336-784-9470   °[facility] [address] [place], [place] 336-227-7417 Accepts Medicaid  °[facility] [address]  ° [place] 1-800-659-3381 Substance Abuse/Addiction Treatment  ° °[place] Behavioral Health Resources °Organization         Address  Phone  Notes  °[facility]  (888) 581-9988   °Julie Brannon, PhD [address] [place], [place]   (336) 349-5553 or (336) 951-0000   °[facility]   [address] °[place], [place] (336) 349-4454   °[facility] [address], [place], [place] (336) 342-8316 Insurance/Medicaid/sponsorship through [facility]  °[facility] [address]                                    [place], [place] (336) 342-8316 Therapy/tele-psych/case  °[facility] [address]  ° [place], [place] (336) 349-2233    °Dr. Arfeen  (336) 349-4544   °[facility] [place]  [employer] [place] [facility] 1) [address], [place] °2) [address], [place] °3)  [address], [place] (336) 349-3220 °(336) 342-7768 ° °(336) 342-8140   °[place] [facility] (336) 342-1394 or (336) 342-3537 (After Hours)    ° ° ° °
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
## 2020-01-24 ENCOUNTER — Emergency Department (HOSPITAL_COMMUNITY)
Admission: EM | Admit: 2020-01-24 | Discharge: 2020-01-25 | Disposition: A | Discharge status: Home / Self Care | Payer: No Typology Code available for payment source | Attending: Emergency Medicine | Admitting: Emergency Medicine

## 2020-01-24 ENCOUNTER — Other Ambulatory Visit: Payer: Self-pay

## 2020-01-24 ENCOUNTER — Encounter (HOSPITAL_COMMUNITY): Payer: Self-pay

## 2020-01-24 DIAGNOSIS — F1010 Alcohol abuse, uncomplicated: Secondary | ICD-10-CM

## 2020-01-24 DIAGNOSIS — F17210 Nicotine dependence, cigarettes, uncomplicated: Secondary | ICD-10-CM | POA: Insufficient documentation

## 2020-01-24 DIAGNOSIS — D649 Anemia, unspecified: Secondary | ICD-10-CM

## 2020-01-24 DIAGNOSIS — D696 Thrombocytopenia, unspecified: Secondary | ICD-10-CM

## 2020-01-24 DIAGNOSIS — F10129 Alcohol abuse with intoxication, unspecified: Secondary | ICD-10-CM | POA: Insufficient documentation

## 2020-01-24 DIAGNOSIS — R748 Abnormal levels of other serum enzymes: Secondary | ICD-10-CM | POA: Diagnosis not present

## 2020-01-24 DIAGNOSIS — R7401 Elevation of levels of liver transaminase levels: Secondary | ICD-10-CM

## 2020-01-24 LAB — CBC WITH DIFFERENTIAL/PLATELET
Abs Immature Granulocytes: 0.01 10*3/uL (ref 0.00–0.07)
Basophils Absolute: 0.1 10*3/uL (ref 0.0–0.1)
Basophils Relative: 1 %
Eosinophils Absolute: 0.1 10*3/uL (ref 0.0–0.5)
Eosinophils Relative: 2 %
HCT: 34.7 % — ABNORMAL LOW (ref 39.0–52.0)
Hemoglobin: 11.8 g/dL — ABNORMAL LOW (ref 13.0–17.0)
Immature Granulocytes: 0 %
Lymphocytes Relative: 35 %
Lymphs Abs: 1.9 10*3/uL (ref 0.7–4.0)
MCH: 31.1 pg (ref 26.0–34.0)
MCHC: 34 g/dL (ref 30.0–36.0)
MCV: 91.6 fL (ref 80.0–100.0)
Monocytes Absolute: 0.9 10*3/uL (ref 0.1–1.0)
Monocytes Relative: 15 %
Neutro Abs: 2.6 10*3/uL (ref 1.7–7.7)
Neutrophils Relative %: 47 %
Platelets: 117 10*3/uL — ABNORMAL LOW (ref 150–400)
RBC: 3.79 MIL/uL — ABNORMAL LOW (ref 4.22–5.81)
RDW: 13.1 % (ref 11.5–15.5)
WBC: 5.6 10*3/uL (ref 4.0–10.5)
nRBC: 0 % (ref 0.0–0.2)

## 2020-01-24 MED ORDER — CHLORDIAZEPOXIDE HCL 25 MG PO CAPS: ORAL_CAPSULE | ORAL | 0 refills | Status: AC

## 2020-01-24 MED ORDER — LORAZEPAM 2 MG/ML IJ SOLN
2.0000 mg | Freq: Once | INTRAMUSCULAR | Status: AC
  Administered 2020-01-24: 22:00:00 2 mg via INTRAVENOUS
  Filled 2020-01-24: qty 1

## 2020-01-24 MED ORDER — LACTATED RINGERS IV BOLUS
1000.0000 mL | Freq: Once | INTRAVENOUS | Status: AC
  Administered 2020-01-24: 22:00:00 1000 mL via INTRAVENOUS

## 2020-01-24 MED ORDER — ONDANSETRON HCL 4 MG/2ML IJ SOLN
4.0000 mg | Freq: Once | INTRAMUSCULAR | Status: AC
  Administered 2020-01-24: 4 mg via INTRAVENOUS
  Filled 2020-01-24: qty 2

## 2020-01-24 NOTE — ED Provider Notes (Signed)
[facility]-EMERGENCY DEPT Provider Note   CSN: [id number] Arrival date & time: 01/24/20  2139     History Chief Complaint  Patient presents with  . Alcohol Intoxication  . Detox    Mario Matthews is a 39 y.o. male.  HPI   38yM with etoh abuse.Has been through detox/rehabd and then relapsed 6 months later.  Currently drinking daily. Went to [place] for detox but apparently they did not feel he fit medical nor psychiatric criteria to admit him. Came here instead. Feels shaky. Nauseated. Headache.   Past Medical History:  Diagnosis Date  . Allergy   . Back muscle spasm   . PTSD (post-traumatic stress disorder)     There are no problems to display for this patient.   Past Surgical History:  Procedure Laterality Date  . BACK SURGERY    . KNEE ARTHROSCOPY         Family History  Problem Relation Age of Onset  . Stroke Mother     Social History   Tobacco Use  . Smoking status: Current Every Day Smoker  . Smokeless tobacco: Never Used  Substance Use Topics  . Alcohol use: Yes    Comment: 5th every 2 days  . Drug use: No    Home Medications Prior to Admission medications   Medication Sig Start Date End Date Taking? Authorizing Provider  cyclobenzaprine (FLEXERIL) 10 MG tablet Take 10 mg by mouth 3 (three) times daily as needed for muscle spasms.   Yes [provider]  ibuprofen (ADVIL) 200 MG tablet Take 600-800 mg by mouth every 6 (six) hours as needed for moderate pain.   Yes [provider]  chlordiazePOXIDE (LIBRIUM) 25 MG capsule 50mg  PO TID x 1D, then 25-50mg  PO BID X 1D, then 25-50mg  PO QD X 1D 01/24/20   03/25/20, MD    Allergies    Gabapentin  Review of Systems   Review of Systems All systems reviewed and negative, other than as noted in HPI.  Physical Exam Updated Vital Signs BP 133/83 (BP Location: Left Arm)   Pulse 97   Temp 98.6 F (37 C) (Oral)   Resp 20   Ht 6\' 1"  (1.854 m)   Wt 84.8 kg   SpO2  100%   BMI 24.67 kg/m   Physical Exam Vitals and nursing note reviewed.  Constitutional:      General: He is not in acute distress.    Appearance: He is well-developed.  HENT:     Head: Normocephalic and atraumatic.  Eyes:     General:        Right eye: No discharge.        Left eye: No discharge.     Conjunctiva/sclera: Conjunctivae normal.  Cardiovascular:     Rate and Rhythm: Normal rate and regular rhythm.     Heart sounds: Normal heart sounds. No murmur. No friction rub. No gallop.   Pulmonary:     Effort: Pulmonary effort is normal. No respiratory distress.     Breath sounds: Normal breath sounds.  Abdominal:     General: There is no distension.     Palpations: Abdomen is soft.     Tenderness: There is no abdominal tenderness.  Musculoskeletal:        General: No tenderness.     Cervical back: Neck supple.  Skin:    General: Skin is warm and dry.  Neurological:     Mental Status: He is alert.  Psychiatric:  Behavior: Behavior normal.        Thought Content: Thought content normal.     ED Results / Procedures / Treatments   Labs (all labs ordered are listed, but only abnormal results are displayed) Labs Reviewed  CBC WITH DIFFERENTIAL/PLATELET - Abnormal; Notable for the following components:      Result Value   RBC 3.79 (*)    Hemoglobin 11.8 (*)    HCT 34.7 (*)    Platelets 117 (*)    All other components within normal limits  COMPREHENSIVE METABOLIC PANEL - Abnormal; Notable for the following components:   AST 135 (*)    ALT 53 (*)    All other components within normal limits  LIPASE, BLOOD - Abnormal; Notable for the following components:   Lipase 58 (*)    All other components within normal limits    EKG None  Radiology No results found.  Procedures Procedures (including critical care time)  Medications Ordered in ED Medications  lactated ringers bolus 1,000 mL (1,000 mLs Intravenous New Bag/Given 01/24/20 2224)  LORazepam (ATIVAN)  injection 2 mg (2 mg Intravenous Given 01/24/20 2222)  ondansetron (ZOFRAN) injection 4 mg (4 mg Intravenous Given 01/24/20 2224)    ED Course  I have reviewed the triage vital signs and the nursing notes.  Pertinent labs & imaging results that were available during my care of the patient were reviewed by me and considered in my medical decision making (see chart for details).    MDM Rules/Calculators/A&P                     38yM with etoh abuse seeking detox. Doesn't require medical admit for etoh withdrawal at this time. Will place on librium and provide with outpatient resources.  Final Clinical Impression(s) / ED Diagnoses Final diagnoses:  ETOH abuse    Rx / DC Orders ED Discharge Orders         Ordered    chlordiazePOXIDE (LIBRIUM) 25 MG capsule     01/24/20 2300           Virgel Manifold, MD 01/25/20 1751

## 2020-01-24 NOTE — ED Triage Notes (Signed)
Pt BIB [facility] from home. Pt is a daily drinker, usually drinks a 5th every 2 days. Pt did drink today and had a shot before coming to the ED. Pt c/o back pain and headache. Pt was see at the [place] today for help with detox, pt states they provided no help. Pt was given 4mg  of Zofran by EMS.   Pt states he went through withdrawals/detox in 09/2018 and relapsed 52mo later. Pt is A&Ox4.

## 2020-01-25 LAB — COMPREHENSIVE METABOLIC PANEL
ALT: 53 U/L — ABNORMAL HIGH (ref 0–44)
AST: 135 U/L — ABNORMAL HIGH (ref 15–41)
Albumin: 4.1 g/dL (ref 3.5–5.0)
Alkaline Phosphatase: 51 U/L (ref 38–126)
Anion gap: 13 (ref 5–15)
BUN: 8 mg/dL (ref 6–20)
CO2: 28 mmol/L (ref 22–32)
Calcium: 9 mg/dL (ref 8.9–10.3)
Chloride: 100 mmol/L (ref 98–111)
Creatinine, Ser: 0.74 mg/dL (ref 0.61–1.24)
GFR calc Af Amer: >60 mL/min (ref >60)
GFR calc non Af Amer: >60 mL/min (ref >60)
Glucose, Bld: 93 mg/dL (ref 70–99)
Potassium: 3.7 mmol/L (ref 3.5–5.1)
Sodium: 141 mmol/L (ref 135–145)
Total Bilirubin: 0.5 mg/dL (ref 0.3–1.2)
Total Protein: 7.4 g/dL (ref 6.5–8.1)

## 2020-01-25 LAB — LIPASE, BLOOD: Lipase: 58 U/L — ABNORMAL HIGH (ref 11–51)

## 2020-01-25 NOTE — ED Provider Notes (Signed)
Care assumed from Dr. Juleen China, patient with alcohol abuse and seeking detox, not qualifying for inpatient detox.  Labs show mild elevation of transaminases, mild elevation of lipase which is probably not clinically significant.  Also, mild anemia and thrombocytopenia.  Thrombocytopenia and transaminase elevations are clearly secondary to alcohol abuse.  He is felt to be safe for discharge and is sent home with prescription for chlordiazepoxide and given outpatient resources for drug treatment.   Dione Booze, MD 01/25/20 [phone]

## 2021-08-06 ENCOUNTER — Ambulatory Visit: Payer: No Typology Code available for payment source

## 2023-03-21 ENCOUNTER — Emergency Department (HOSPITAL_COMMUNITY)
Admission: EM | Admit: 2023-03-21 | Discharge: 2023-03-22 | Disposition: A | Discharge status: Home / Self Care | Payer: No Typology Code available for payment source | Attending: Emergency Medicine | Admitting: Emergency Medicine

## 2023-03-21 ENCOUNTER — Other Ambulatory Visit: Payer: Self-pay

## 2023-03-21 ENCOUNTER — Encounter (HOSPITAL_COMMUNITY): Payer: Self-pay

## 2023-03-21 DIAGNOSIS — K219 Gastro-esophageal reflux disease without esophagitis: Secondary | ICD-10-CM | POA: Diagnosis not present

## 2023-03-21 DIAGNOSIS — R079 Chest pain, unspecified: Secondary | ICD-10-CM | POA: Diagnosis present

## 2023-03-21 MED ORDER — ALUM & MAG HYDROXIDE-SIMETH 200-200-20 MG/5ML PO SUSP
30.0000 mL | Freq: Once | ORAL | Status: AC
  Administered 2023-03-22: 30 mL via ORAL
  Filled 2023-03-21: qty 30

## 2023-03-21 MED ORDER — LIDOCAINE VISCOUS HCL 2 % MT SOLN
15.0000 mL | Freq: Once | OROMUCOSAL | Status: AC
  Administered 2023-03-22: 15 mL via ORAL
  Filled 2023-03-21: qty 15

## 2023-03-21 NOTE — ED Triage Notes (Signed)
[facility] from home c/o chest pain. Two types of chest pain a dull pain he has had for a week rating 6/10 that is constant. Also having sharp stabbing pain he rates 10/10, pt states it is radiating to left arm. Pt noticed bruising to chest, none visible.No n/v/d or sob 324 mg asa given  Bp- 130/87 Hr-78 100% ra  Cbg 101

## 2023-03-21 NOTE — ED Provider Notes (Signed)
[place] EMERGENCY DEPARTMENT AT [facility] Provider Note  CSN: [id number] Arrival date & time: 03/21/23 2318  Chief Complaint(s) Chest Pain  HPI Mario Matthews is a 42 y.o. male here with left sided chest pain Intermittent for several weeks. Constant for 3 hours, but fluctuating in intensity. Burning stabbing pain Radiates to left arm. No alleviating or aggravating factors No SOB, N/V No cough or congestion Concerned it might be a heart attack or a blood clot. No prior h/o DVT or PE. Patient states that he is been on chronic NSAIDs for several years.  States that he was given doxycycline 2 weeks ago for ingrown hair.  The history is provided by the patient.    Past Medical History Past Medical History:  Diagnosis Date   Allergy    Back muscle spasm    PTSD (post-traumatic stress disorder)    There are no problems to display for this patient.  Home Medication(s) Prior to Admission medications   Medication Sig Start Date End Date Taking? Authorizing Provider  pantoprazole (PROTONIX) 20 MG tablet Take 1 tablet (20 mg total) by mouth daily. 03/22/23  Yes Letishia Elliott, Amadeo Garnet, MD  chlordiazePOXIDE (LIBRIUM) 25 MG capsule 50mg  PO TID x 1D, then 25-50mg  PO BID X 1D, then 25-50mg  PO QD X 1D 01/24/20   Raeford Razor, MD  cyclobenzaprine (FLEXERIL) 10 MG tablet Take 10 mg by mouth 3 (three) times daily as needed for muscle spasms.    [provider]  ibuprofen (ADVIL) 200 MG tablet Take 600-800 mg by mouth every 6 (six) hours as needed for moderate pain.    [provider]                                                                                                                                    Allergies Gabapentin  Review of Systems Review of Systems As noted in HPI  Physical Exam Vital Signs  I have reviewed the triage vital signs BP 108/89 (BP Location: Right Arm)   Pulse 79   Temp 98.3 F (36.8 C) (Oral)   Resp 20   SpO2 100%    Physical Exam Vitals reviewed.  Constitutional:      General: He is not in acute distress.    Appearance: He is well-developed. He is not diaphoretic.  HENT:     Head: Normocephalic and atraumatic.     Nose: Nose normal.  Eyes:     General: No scleral icterus.       Right eye: No discharge.        Left eye: No discharge.     Conjunctiva/sclera: Conjunctivae normal.     Pupils: Pupils are equal, round, and reactive to light.  Cardiovascular:     Rate and Rhythm: Normal rate and regular rhythm.     Heart sounds: No murmur heard.    No friction rub. No gallop.  Pulmonary:  Effort: Pulmonary effort is normal. No respiratory distress.     Breath sounds: Normal breath sounds. No stridor. No rales.  Chest:     Chest wall: Tenderness present.    Abdominal:     General: There is no distension.     Palpations: Abdomen is soft.     Tenderness: There is no abdominal tenderness.  Musculoskeletal:        General: No tenderness.     Cervical back: Normal range of motion and neck supple.  Skin:    General: Skin is warm and dry.     Findings: No erythema or rash.  Neurological:     Mental Status: He is alert and oriented to person, place, and time.     ED Results and Treatments Labs (all labs ordered are listed, but only abnormal results are displayed) Labs Reviewed  CBC - Abnormal; Notable for the following components:      Result Value   Hemoglobin 12.8 (*)    All other components within normal limits  COMPREHENSIVE METABOLIC PANEL  LIPASE, BLOOD  D-DIMER, QUANTITATIVE  TROPONIN I (HIGH SENSITIVITY)  TROPONIN I (HIGH SENSITIVITY)                                                                                                                         EKG  EKG Interpretation Date/Time:  Saturday March 21 2023 23:24:21 EDT Ventricular Rate:  77 PR Interval:  158 QRS Duration:  103 QT Interval:  386 QTC Calculation: 437 R Axis:   86  Text Interpretation: Sinus rhythm  ST elev, probable normal early repol pattern No acute changes Confirmed by Drema Pry [phone]) on 03/21/2023 11:41:32 PM       Radiology DG Chest 2 View  Result Date: 03/22/2023 CLINICAL DATA:  Chest pain EXAM: CHEST - 2 VIEW COMPARISON:  08/01/2014 FINDINGS: The heart size and mediastinal contours are within normal limits. Both lungs are clear. The visualized skeletal structures are unremarkable. IMPRESSION: No active cardiopulmonary disease. Electronically Signed   By: Helyn Numbers M.D.   On: 03/22/2023 00:37    Medications Ordered in ED Medications  alum & mag hydroxide-simeth (MAALOX/MYLANTA) 200-200-20 MG/5ML suspension 30 mL (30 mLs Oral Given 03/22/23 0013)    And  lidocaine (XYLOCAINE) 2 % viscous mouth solution 15 mL (15 mLs Oral Given 03/22/23 0013)   Procedures Procedures  (including critical care time) Medical Decision Making / ED Course   Medical Decision Making Amount and/or Complexity of Data Reviewed Labs: ordered. Radiology: ordered.  Risk OTC drugs. Prescription drug management.    Chest pain. Highly atypical for ACS. EKG without acute ischemic changes or evidence of pericarditis.  Heart score less than 3. Troponins negative x 2  Low suspicion for pulmonary embolism.  Given patient's concern, dimer obtain.  Dimer negative  CBC without leukocytosis or anemia Metabolic panel without significant electrolyte derangements or renal sufficiency.  No evidence of bili obstruction or pancreatitis.  Presentation not classic for dissection  or esophageal perforation.  Chest x-ray negative for pneumonia, pneumothorax, pulmonary edema or pleural effusion.  Favoring MSK versus GI. Pain significantly improved following GI cocktail.  Will Rx PPI.       Final Clinical Impression(s) / ED Diagnoses Final diagnoses:  Chest pain due to GERD   The patient appears reasonably screened and/or stabilized for discharge and I doubt any other medical condition or other  [facility] requiring further screening, evaluation, or treatment in the ED at this time. I have discussed the findings, Dx and Tx plan with the patient/family who expressed understanding and agree(s) with the plan. Discharge instructions discussed at length. The patient/family was given strict return precautions who verbalized understanding of the instructions. No further questions at time of discharge.  Disposition: Discharge  Condition: Good  ED Discharge Orders          Ordered    pantoprazole (PROTONIX) 20 MG tablet  Daily        03/22/23 0233            Follow Up: [facility] [address] [facility] [place] [address] [phone]  Call  to schedule an appointment for close follow up    This chart was dictated using voice recognition software.  Despite best efforts to proofread,  errors can occur which can change the documentation meaning.    Nira Conn, MD 03/22/23 [phone]

## 2023-03-21 NOTE — ED Notes (Signed)
EDP at bedside  

## 2023-03-21 NOTE — ED Provider Notes (Incomplete)
[place] EMERGENCY DEPARTMENT AT [facility] Provider Note  CSN: [id number] Arrival date & time: 03/21/23 2318  Chief Complaint(s) Chest Pain  HPI Mario Matthews is a 42 y.o. male {Add pertinent medical, surgical, social history, OB history to HPI:1}   HPI  Past Medical History Past Medical History:  Diagnosis Date  . Allergy   . Back muscle spasm   . PTSD (post-traumatic stress disorder)    There are no problems to display for this patient.  Home Medication(s) Prior to Admission medications   Medication Sig Start Date End Date Taking? Authorizing Provider  chlordiazePOXIDE (LIBRIUM) 25 MG capsule 50mg  PO TID x 1D, then 25-50mg  PO BID X 1D, then 25-50mg  PO QD X 1D 01/24/20   Raeford Razor, MD  cyclobenzaprine (FLEXERIL) 10 MG tablet Take 10 mg by mouth 3 (three) times daily as needed for muscle spasms.    [provider]  ibuprofen (ADVIL) 200 MG tablet Take 600-800 mg by mouth every 6 (six) hours as needed for moderate pain.    [provider]                                                                                                                                    Allergies Gabapentin  Review of Systems Review of Systems As noted in HPI  Physical Exam Vital Signs  I have reviewed the triage vital signs BP 116/87   Pulse 77   Temp 98.3 F (36.8 C) (Oral)   SpO2 100%  *** Physical Exam  ED Results and Treatments Labs (all labs ordered are listed, but only abnormal results are displayed) Labs Reviewed - No data to display                                                                                                                       EKG  EKG Interpretation Date/Time:  Saturday March 21 2023 23:24:21 EDT Ventricular Rate:  77 PR Interval:  158 QRS Duration:  103 QT Interval:  386 QTC Calculation: 437 R Axis:   86  Text Interpretation: Sinus rhythm ST elev, probable normal early repol pattern No acute changes Confirmed  by Drema Pry [phone]) on 03/21/2023 11:41:32 PM       Radiology No results found.  Medications Ordered in ED Medications - No data to display Procedures Procedures  (including critical care time) Medical Decision Making / ED Course  Medical Decision Making   ***    Final Clinical Impression(s) / ED Diagnoses Final diagnoses:  None    This chart was dictated using voice recognition software.  Despite best efforts to proofread,  errors can occur which can change the documentation meaning.

## 2023-03-22 ENCOUNTER — Emergency Department (HOSPITAL_COMMUNITY): Payer: No Typology Code available for payment source

## 2023-03-22 LAB — COMPREHENSIVE METABOLIC PANEL
ALT: 30 U/L (ref 0–44)
AST: 32 U/L (ref 15–41)
Albumin: 4 g/dL (ref 3.5–5.0)
Alkaline Phosphatase: 52 U/L (ref 38–126)
Anion gap: 9 (ref 5–15)
BUN: 13 mg/dL (ref 6–20)
CO2: 25 mmol/L (ref 22–32)
Calcium: 9.3 mg/dL (ref 8.9–10.3)
Chloride: 103 mmol/L (ref 98–111)
Creatinine, Ser: 1.07 mg/dL (ref 0.61–1.24)
GFR, Estimated: >60 mL/min (ref >60)
Glucose, Bld: 95 mg/dL (ref 70–99)
Potassium: 3.8 mmol/L (ref 3.5–5.1)
Sodium: 137 mmol/L (ref 135–145)
Total Bilirubin: 0.5 mg/dL (ref 0.3–1.2)
Total Protein: 6.8 g/dL (ref 6.5–8.1)

## 2023-03-22 LAB — CBC
HCT: 39.9 % (ref 39.0–52.0)
Hemoglobin: 12.8 g/dL — ABNORMAL LOW (ref 13.0–17.0)
MCH: 28.3 pg (ref 26.0–34.0)
MCHC: 32.1 g/dL (ref 30.0–36.0)
MCV: 88.3 fL (ref 80.0–100.0)
Platelets: 204 10*3/uL (ref 150–400)
RBC: 4.52 MIL/uL (ref 4.22–5.81)
RDW: 12.1 % (ref 11.5–15.5)
WBC: 8.7 10*3/uL (ref 4.0–10.5)
nRBC: 0 % (ref 0.0–0.2)

## 2023-03-22 LAB — TROPONIN I (HIGH SENSITIVITY)
Troponin I (High Sensitivity): 3 ng/L (ref <18)
Troponin I (High Sensitivity): 3 ng/L (ref <18)

## 2023-03-22 LAB — D-DIMER, QUANTITATIVE: D-Dimer, Quant: <0.27 ug/mL-FEU (ref 0.00–0.50)

## 2023-03-22 LAB — LIPASE, BLOOD: Lipase: 28 U/L (ref 11–51)

## 2023-03-22 MED ORDER — PANTOPRAZOLE SODIUM 20 MG PO TBEC: 20.0000 mg | DELAYED_RELEASE_TABLET | Freq: Every day | ORAL | 0 refills | Status: AC
